# Patient Record
Sex: Male | Born: 1970 | Marital: Married | Smoking: Never smoker
Health system: Southern US, Community
[De-identification: ages and names within clinical notes are randomized; demographics above are authoritative.]

## PROBLEM LIST (undated history)

## (undated) DIAGNOSIS — E785 Hyperlipidemia, unspecified: Secondary | ICD-10-CM

## (undated) DIAGNOSIS — F909 Attention-deficit hyperactivity disorder, unspecified type: Secondary | ICD-10-CM

## (undated) HISTORY — DX: Hyperlipidemia, unspecified: E78.5

## (undated) HISTORY — DX: Attention-deficit hyperactivity disorder, unspecified type: F90.9

---

## 2006-08-21 ENCOUNTER — Ambulatory Visit: Payer: Self-pay | Admitting: Internal Medicine

## 2006-08-21 LAB — CONVERTED CEMR LAB
ALT: 162 units/L — ABNORMAL HIGH (ref 0–40)
AST: 75 units/L — ABNORMAL HIGH (ref 0–37)
Albumin: 4.1 g/dL (ref 3.5–5.2)
Alkaline Phosphatase: 48 units/L (ref 39–117)
BUN: 11 mg/dL (ref 6–23)
Basophils Absolute: 0 10*3/uL (ref 0.0–0.1)
Basophils Relative: 0.7 % (ref 0.0–1.0)
CO2: 30 meq/L (ref 19–32)
Calcium: 9.3 mg/dL (ref 8.4–10.5)
Chloride: 103 meq/L (ref 96–112)
Chol/HDL Ratio, serum: 6.3
Cholesterol: 249 mg/dL (ref 0–200)
Creatinine, Ser: 1 mg/dL (ref 0.4–1.5)
Eosinophil percent: 3 % (ref 0.0–5.0)
GFR calc non Af Amer: 90 mL/min
Glomerular Filtration Rate, Af Am: 109 mL/min/{1.73_m2}
Glucose, Bld: 102 mg/dL — ABNORMAL HIGH (ref 70–99)
HCT: 44.9 % (ref 39.0–52.0)
HDL: 39.8 mg/dL (ref >39.0)
Hemoglobin: 15.2 g/dL (ref 13.0–17.0)
LDL DIRECT: 198 mg/dL
Lymphocytes Relative: 30.8 % (ref 12.0–46.0)
MCHC: 33.9 g/dL (ref 30.0–36.0)
MCV: 98.7 fL (ref 78.0–100.0)
Monocytes Absolute: 0.5 10*3/uL (ref 0.2–0.7)
Monocytes Relative: 11.1 % — ABNORMAL HIGH (ref 3.0–11.0)
Neutro Abs: 2.3 10*3/uL (ref 1.4–7.7)
Neutrophils Relative %: 54.4 % (ref 43.0–77.0)
Platelets: 174 10*3/uL (ref 150–400)
Potassium: 4.3 meq/L (ref 3.5–5.1)
RBC: 4.55 M/uL (ref 4.22–5.81)
RDW: 11.5 % (ref 11.5–14.6)
Sodium: 139 meq/L (ref 135–145)
TSH: 2.91 microintl units/mL (ref 0.35–5.50)
Total Bilirubin: 1 mg/dL (ref 0.3–1.2)
Total Protein: 7.2 g/dL (ref 6.0–8.3)
Triglyceride fasting, serum: 54 mg/dL (ref 0–149)
VLDL: 11 mg/dL (ref 0–40)
WBC: 4.2 10*3/uL — ABNORMAL LOW (ref 4.5–10.5)

## 2006-08-28 ENCOUNTER — Ambulatory Visit: Payer: Self-pay | Admitting: Internal Medicine

## 2006-08-28 DIAGNOSIS — R740 Nonspecific elevation of levels of transaminase and lactic acid dehydrogenase [LDH]: Secondary | ICD-10-CM

## 2006-08-28 DIAGNOSIS — E785 Hyperlipidemia, unspecified: Secondary | ICD-10-CM

## 2006-10-28 ENCOUNTER — Ambulatory Visit: Payer: Self-pay | Admitting: Internal Medicine

## 2006-10-28 LAB — CONVERTED CEMR LAB
ALT: 106 units/L — ABNORMAL HIGH (ref 0–40)
AST: 53 units/L — ABNORMAL HIGH (ref 0–37)
Albumin: 4.2 g/dL (ref 3.5–5.2)
Alkaline Phosphatase: 52 units/L (ref 39–117)
Bilirubin, Direct: 0.2 mg/dL (ref 0.0–0.3)
Chol/HDL Ratio, serum: 5.4
Cholesterol: 240 mg/dL (ref 0–200)
HDL: 44.3 mg/dL (ref >39.0)
LDL DIRECT: 176.1 mg/dL
Total Bilirubin: 0.8 mg/dL (ref 0.3–1.2)
Total Protein: 7.1 g/dL (ref 6.0–8.3)
Triglyceride fasting, serum: 103 mg/dL (ref 0–149)
VLDL: 21 mg/dL (ref 0–40)

## 2006-11-07 ENCOUNTER — Ambulatory Visit: Payer: Self-pay | Admitting: Internal Medicine

## 2008-06-24 ENCOUNTER — Ambulatory Visit: Payer: Self-pay | Admitting: Internal Medicine

## 2008-06-28 LAB — CONVERTED CEMR LAB
Alkaline Phosphatase: 44 units/L (ref 39–117)
Bilirubin, Direct: 0.2 mg/dL (ref 0.0–0.3)
HDL: 43.1 mg/dL (ref >39.0)
Total Bilirubin: 1.3 mg/dL — ABNORMAL HIGH (ref 0.3–1.2)
Total CHOL/HDL Ratio: 5.9
VLDL: 13 mg/dL (ref 0–40)

## 2008-07-01 LAB — CONVERTED CEMR LAB: Hep B Core Total Ab: NEGATIVE

## 2008-07-08 ENCOUNTER — Encounter: Admission: RE | Admit: 2008-07-08 | Discharge: 2008-07-08 | Payer: Self-pay | Admitting: Internal Medicine

## 2009-01-18 ENCOUNTER — Ambulatory Visit: Payer: Self-pay | Admitting: Internal Medicine

## 2009-01-18 LAB — CONVERTED CEMR LAB
AST: 27 units/L (ref 0–37)
BUN: 13 mg/dL (ref 6–23)
Basophils Absolute: 0 10*3/uL (ref 0.0–0.1)
Bilirubin, Direct: 0.1 mg/dL (ref 0.0–0.3)
Calcium: 9.3 mg/dL (ref 8.4–10.5)
Cholesterol: 176 mg/dL (ref 0–200)
Creatinine, Ser: 0.9 mg/dL (ref 0.4–1.5)
GFR calc non Af Amer: 99.69 mL/min (ref >60)
Glucose, Bld: 100 mg/dL — ABNORMAL HIGH (ref 70–99)
HCT: 43.4 % (ref 39.0–52.0)
HDL: 29.8 mg/dL — ABNORMAL LOW (ref >39.00)
LDL Cholesterol: 131 mg/dL — ABNORMAL HIGH (ref 0–99)
Leukocytes, UA: NEGATIVE
Lymphs Abs: 1.7 10*3/uL (ref 0.7–4.0)
Monocytes Absolute: 0.5 10*3/uL (ref 0.1–1.0)
Monocytes Relative: 9.4 % (ref 3.0–12.0)
Neutrophils Relative %: 51.8 % (ref 43.0–77.0)
Nitrite: NEGATIVE
Platelets: 149 10*3/uL — ABNORMAL LOW (ref 150.0–400.0)
Potassium: 4.2 meq/L (ref 3.5–5.1)
RDW: 11.1 % — ABNORMAL LOW (ref 11.5–14.6)
Specific Gravity, Urine: 1.02 (ref 1.000–1.030)
TSH: 1.7 microintl units/mL (ref 0.35–5.50)
Total Bilirubin: 1 mg/dL (ref 0.3–1.2)
Total Protein, Urine: NEGATIVE mg/dL
Triglycerides: 75 mg/dL (ref 0.0–149.0)
VLDL: 15 mg/dL (ref 0.0–40.0)
WBC: 4.9 10*3/uL (ref 4.5–10.5)
pH: 5.5 (ref 5.0–8.0)

## 2009-06-14 ENCOUNTER — Ambulatory Visit: Payer: Self-pay | Admitting: Internal Medicine

## 2009-06-15 LAB — CONVERTED CEMR LAB
AST: 32 units/L (ref 0–37)
Alkaline Phosphatase: 45 units/L (ref 39–117)
Bilirubin, Direct: 0 mg/dL (ref 0.0–0.3)
Direct LDL: 169.8 mg/dL
HDL: 47.2 mg/dL (ref >39.00)
Total CHOL/HDL Ratio: 5
VLDL: 21.2 mg/dL (ref 0.0–40.0)

## 2012-04-06 ENCOUNTER — Telehealth: Payer: Self-pay | Admitting: Internal Medicine

## 2012-04-06 NOTE — Telephone Encounter (Addendum)
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 Pt last seen 2010

## 2012-04-07 MED ORDER — SCOPOLAMINE 1 MG/3DAYS TD PT72: 1.0000 | MEDICATED_PATCH | TRANSDERMAL | Status: AC

## 2012-04-07 NOTE — Telephone Encounter (Signed)
Ok per Dr Swords, rx sent in electronically 

## 2013-05-07 ENCOUNTER — Ambulatory Visit (INDEPENDENT_AMBULATORY_CARE_PROVIDER_SITE_OTHER): Payer: BC Managed Care – PPO | Admitting: Internal Medicine

## 2013-05-07 VITALS — BP 122/76 | HR 86 | Temp 98.0°F | Resp 16 | Ht 69.5 in | Wt 176.0 lb

## 2013-05-07 DIAGNOSIS — A493 Mycoplasma infection, unspecified site: Secondary | ICD-10-CM

## 2013-05-07 MED ORDER — AZITHROMYCIN 250 MG PO TABS: ORAL_TABLET | ORAL | Status: DC

## 2013-05-07 NOTE — Progress Notes (Signed)
  Subjective:    Patient ID: Tanner Bowen, male    DOB: 05/09/1971, 42 y.o.   MRN: [id number]  HPIc/o cough and ST for most of this week following lots of social activity last weekend No fever or chills nonprod cough No wheeze  Sl pnd Lots of business travel and use of voice next 2 weeks    Review of Systems     Objective:   Physical Exam BP 122/76  Pulse 86  Temp(Src) 98 F (36.7 C) (Oral)  Resp 16  Ht 5' 9.5" (1.765 m)  Wt 176 lb (79.833 kg)  BMI 25.63 kg/m2  SpO2 99% Perrla Conj clear Tms clear Nares boggy thr clear x sl erythema     Assessment & Plan:  Symptoms consistent with viral or mycoplasmal illness  Consideration for upcoming travel we'll treat with Zithromax, guaifenesin, and Sudafed as needed

## 2014-04-28 ENCOUNTER — Other Ambulatory Visit: Payer: Self-pay | Admitting: General Surgery

## 2015-08-31 ENCOUNTER — Other Ambulatory Visit: Payer: Self-pay | Admitting: Internal Medicine

## 2015-08-31 DIAGNOSIS — R740 Nonspecific elevation of levels of transaminase and lactic acid dehydrogenase [LDH]: Principal | ICD-10-CM

## 2015-08-31 DIAGNOSIS — R7401 Elevation of levels of liver transaminase levels: Secondary | ICD-10-CM

## 2015-09-06 ENCOUNTER — Other Ambulatory Visit: Payer: Self-pay

## 2016-09-17 ENCOUNTER — Other Ambulatory Visit: Payer: Self-pay | Admitting: Internal Medicine

## 2016-09-17 DIAGNOSIS — R7401 Elevation of levels of liver transaminase levels: Secondary | ICD-10-CM

## 2016-09-17 DIAGNOSIS — R740 Nonspecific elevation of levels of transaminase and lactic acid dehydrogenase [LDH]: Principal | ICD-10-CM

## 2016-09-18 ENCOUNTER — Ambulatory Visit
Admission: RE | Admit: 2016-09-18 | Discharge: 2016-09-18 | Disposition: A | Discharge status: Home / Self Care | Payer: BLUE CROSS/BLUE SHIELD | Source: Ambulatory Visit | Attending: Internal Medicine | Admitting: Internal Medicine

## 2016-09-18 DIAGNOSIS — R7401 Elevation of levels of liver transaminase levels: Secondary | ICD-10-CM

## 2016-09-18 DIAGNOSIS — R740 Nonspecific elevation of levels of transaminase and lactic acid dehydrogenase [LDH]: Principal | ICD-10-CM

## 2017-11-20 ENCOUNTER — Other Ambulatory Visit: Payer: Self-pay | Admitting: Family Medicine

## 2017-11-20 ENCOUNTER — Ambulatory Visit
Admission: RE | Admit: 2017-11-20 | Discharge: 2017-11-20 | Disposition: A | Discharge status: Home / Self Care | Payer: BLUE CROSS/BLUE SHIELD | Source: Ambulatory Visit | Attending: Family Medicine | Admitting: Family Medicine

## 2017-11-20 DIAGNOSIS — R059 Cough, unspecified: Secondary | ICD-10-CM

## 2017-11-20 DIAGNOSIS — R05 Cough: Secondary | ICD-10-CM

## 2019-11-26 ENCOUNTER — Ambulatory Visit: Payer: BC Managed Care – PPO | Attending: Internal Medicine

## 2019-11-26 DIAGNOSIS — Z20822 Contact with and (suspected) exposure to covid-19: Secondary | ICD-10-CM

## 2019-11-27 LAB — NOVEL CORONAVIRUS, NAA: SARS-CoV-2, NAA: NOT DETECTED

## 2020-02-18 ENCOUNTER — Other Ambulatory Visit: Payer: Self-pay | Admitting: Internal Medicine

## 2020-02-18 ENCOUNTER — Other Ambulatory Visit: Payer: Self-pay

## 2020-02-18 DIAGNOSIS — E785 Hyperlipidemia, unspecified: Secondary | ICD-10-CM

## 2020-03-07 ENCOUNTER — Encounter: Payer: Self-pay | Admitting: Neurology

## 2020-03-08 ENCOUNTER — Telehealth: Payer: Self-pay | Admitting: Neurology

## 2020-03-08 ENCOUNTER — Other Ambulatory Visit: Payer: Self-pay

## 2020-03-08 ENCOUNTER — Institutional Professional Consult (permissible substitution): Payer: BC Managed Care – PPO | Admitting: Neurology

## 2020-03-08 NOTE — Telephone Encounter (Signed)
Pt presented today for his 1pm apt at 1:10. Due to arriving > 5 min late we were unable to see the patient at the scheduled apt time of 1:00 pm. Dr Vickey Huger did have a opening at 3 pm this afternoon and front staff offered the patient to stay and be seen at the time but the patient could not wait that long. Pt will need to be rescheduled.

## 2020-03-10 ENCOUNTER — Ambulatory Visit
Admission: RE | Admit: 2020-03-10 | Discharge: 2020-03-10 | Disposition: A | Discharge status: Home / Self Care | Payer: No Typology Code available for payment source | Source: Ambulatory Visit | Attending: Internal Medicine | Admitting: Internal Medicine

## 2020-03-10 DIAGNOSIS — E785 Hyperlipidemia, unspecified: Secondary | ICD-10-CM

## 2021-03-06 ENCOUNTER — Other Ambulatory Visit: Payer: Self-pay | Admitting: Internal Medicine

## 2021-03-06 DIAGNOSIS — R7401 Elevation of levels of liver transaminase levels: Secondary | ICD-10-CM

## 2021-03-23 ENCOUNTER — Encounter: Payer: Self-pay | Admitting: Gastroenterology

## 2021-04-04 ENCOUNTER — Ambulatory Visit
Admission: RE | Admit: 2021-04-04 | Discharge: 2021-04-04 | Disposition: A | Discharge status: Home / Self Care | Payer: BC Managed Care – PPO | Source: Ambulatory Visit | Attending: Internal Medicine | Admitting: Internal Medicine

## 2021-04-04 DIAGNOSIS — R7401 Elevation of levels of liver transaminase levels: Secondary | ICD-10-CM

## 2021-04-10 IMAGING — CT CT CARDIAC CORONARY ARTERY CALCIUM SCORE
3 series · 12 of 20 positions shown, 14 images · non-contrast
Comparison: None.

CLINICAL DATA: High cholesterol

EXAM:
CT CARDIAC CORONARY ARTERY CALCIUM SCORE
TECHNIQUE: Non-contrast imaging through the heart was performed using
prospective ECG gating. Image post processing was performed on an
independent workstation, allowing for quantitative analysis of the
heart and coronary arteries. Note that this exam targets the heart
and the chest was not imaged in its entirety.

[Series 2: calcium scoring 2.00 qr36 bestdiast 70% hrt calciu · axial · 0.35mm/px · z∈[+1731,+1763]mm · 2 of 80 slices shown]
[im 16/80  vessel]
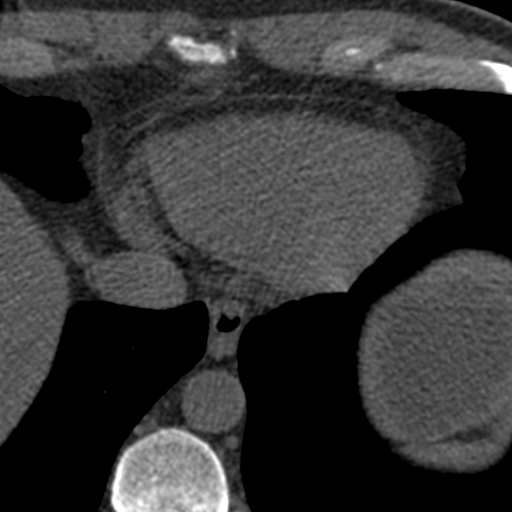
[im 32/80  vessel]
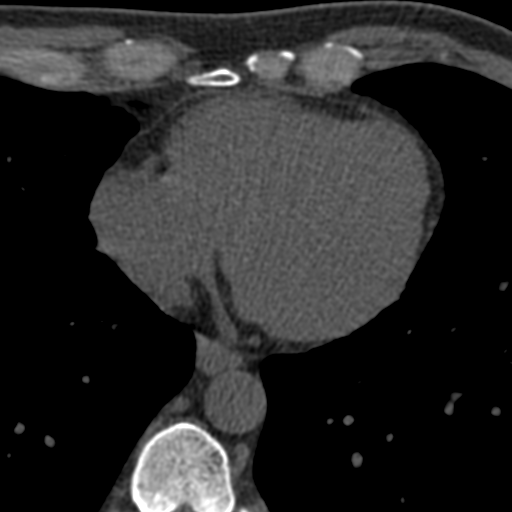

[Series 3: calcium scoring 2.00 br40 bestdiast 70% axial · axial · 0.54mm/px · z∈[+1727,+1831]mm · 5 of 80 slices shown, 7 images]
[im 14/80  vessel]
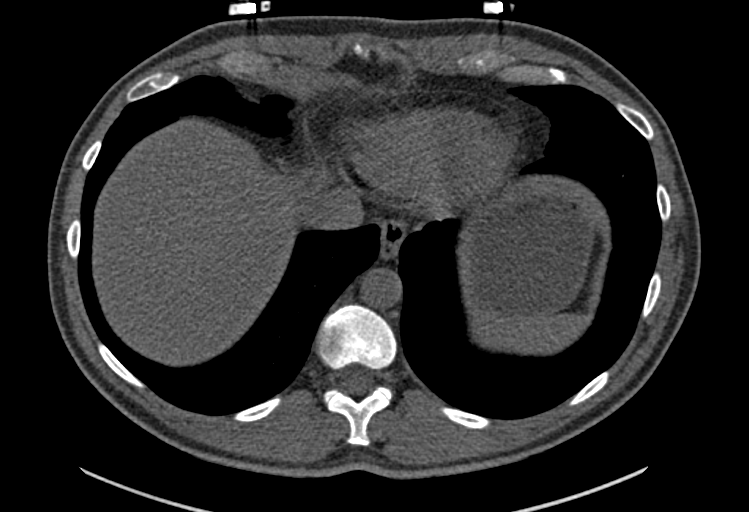
[im 14/80  lung]
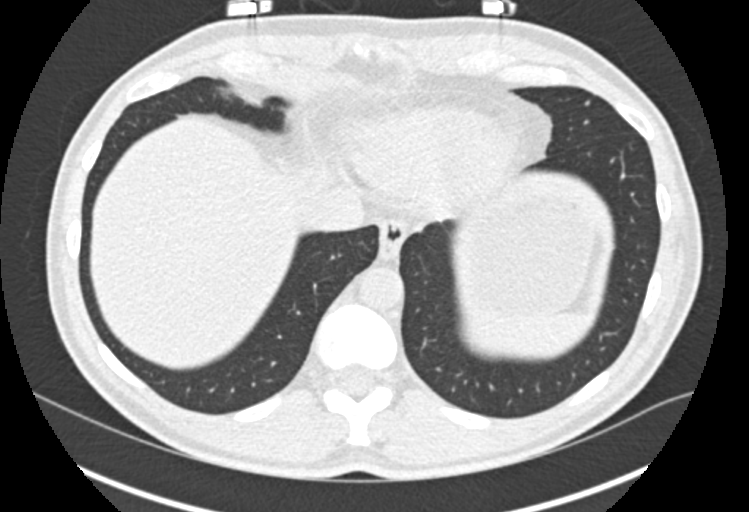
[im 27/80  vessel]
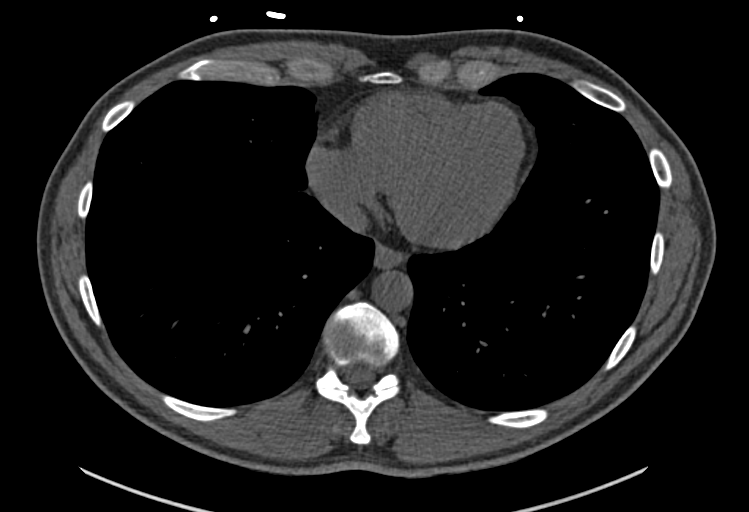
[im 40/80  vessel]
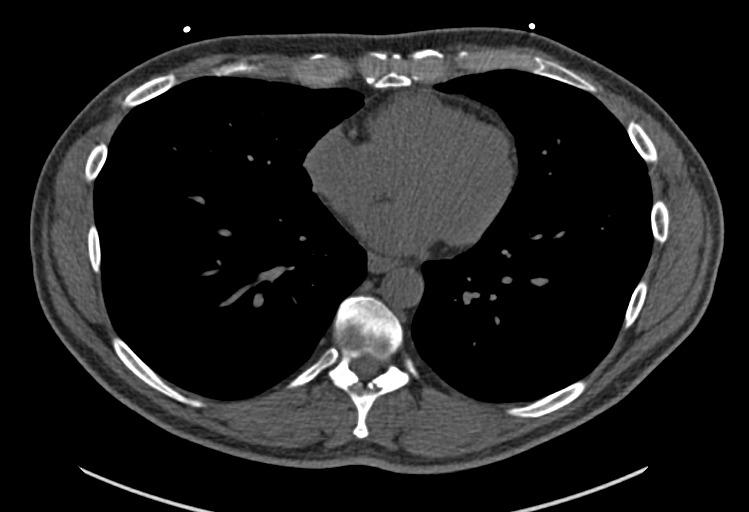
[im 53/80  vessel]
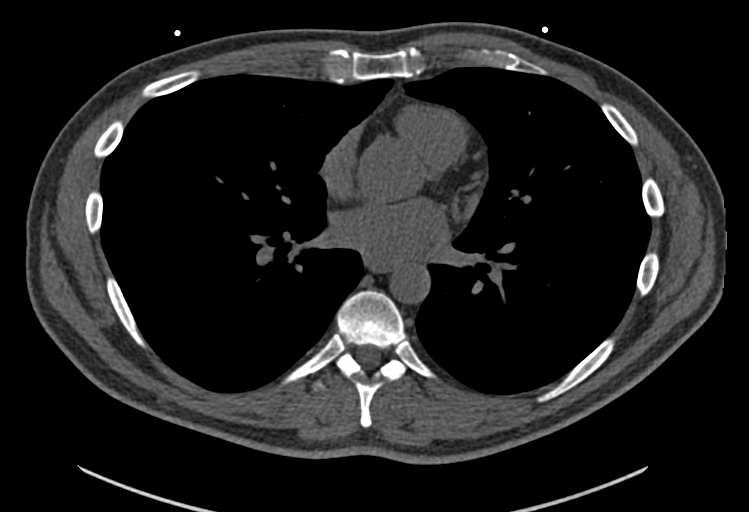
[im 66/80  vessel]
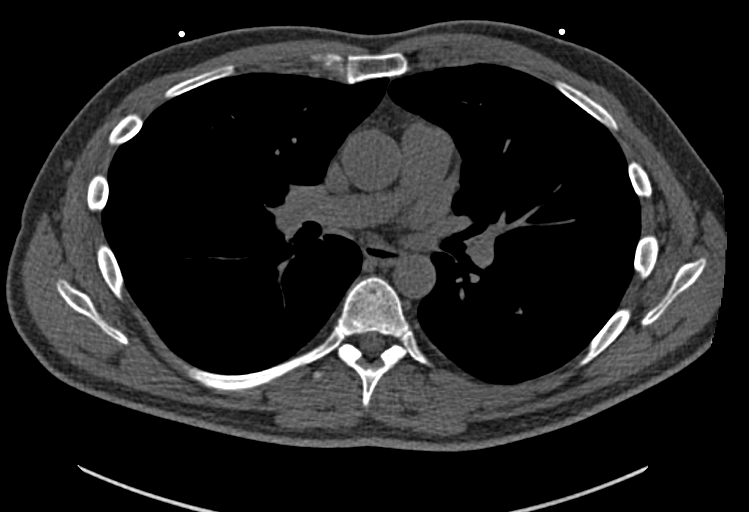
[im 66/80  lung]
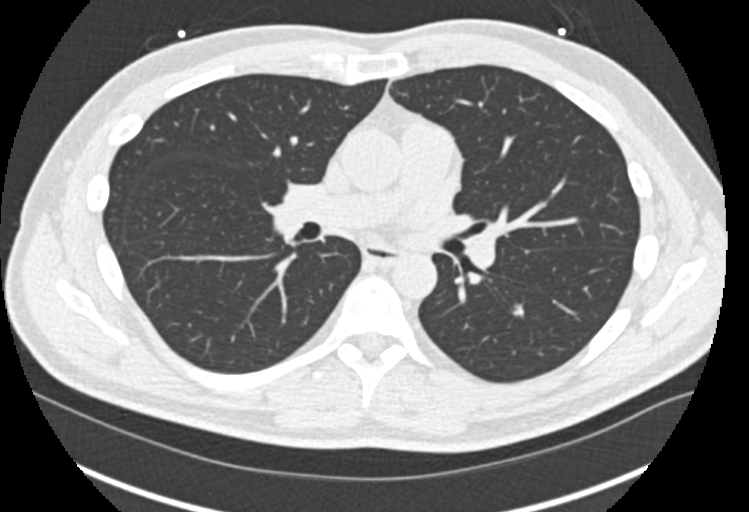

[Series 9: calcium scoring 2.00 br60 bestdiast 70% lungs · axial · 0.54mm/px · z∈[+1727,+1831]mm · 5 of 80 slices shown]
[im 14/80  vessel]
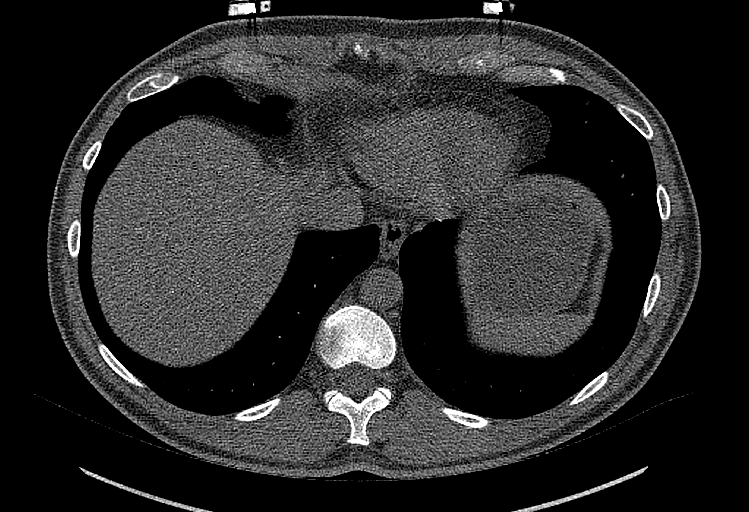
[im 27/80  vessel]
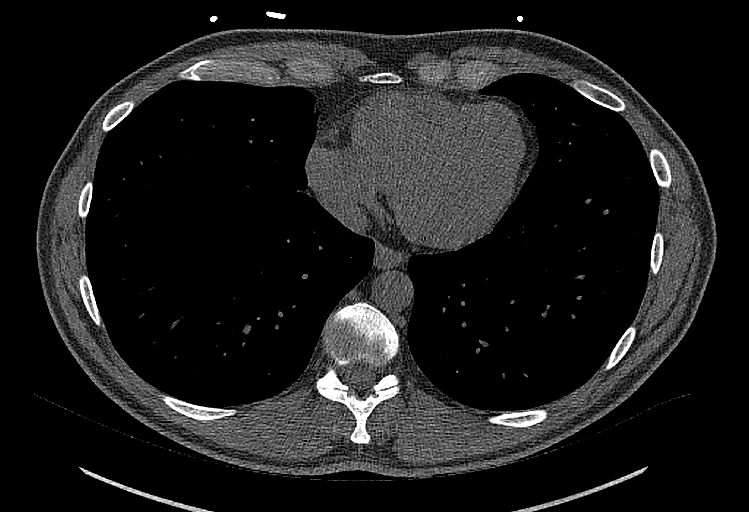
[im 40/80  vessel]
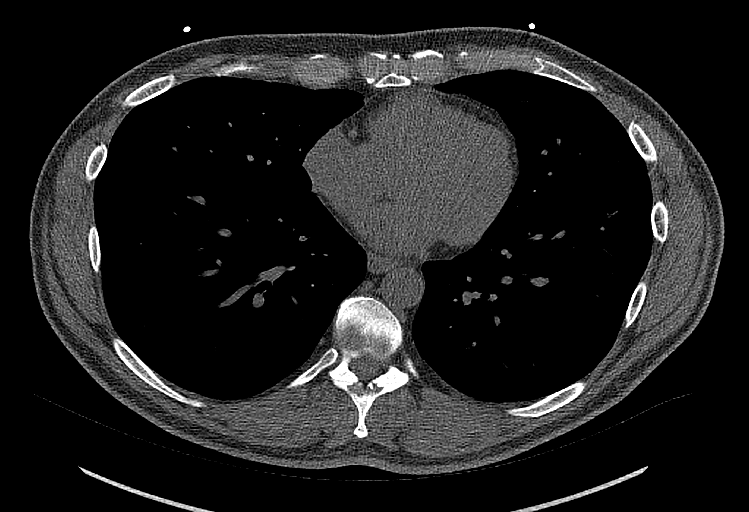
[im 53/80  vessel]
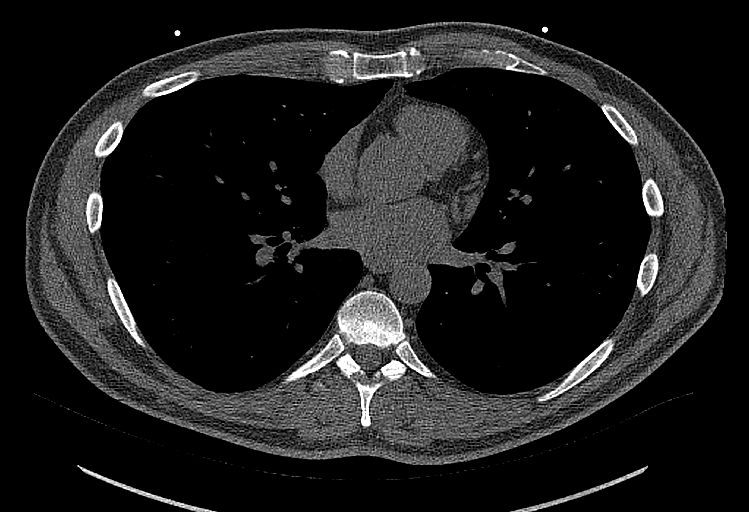
[im 66/80  vessel]
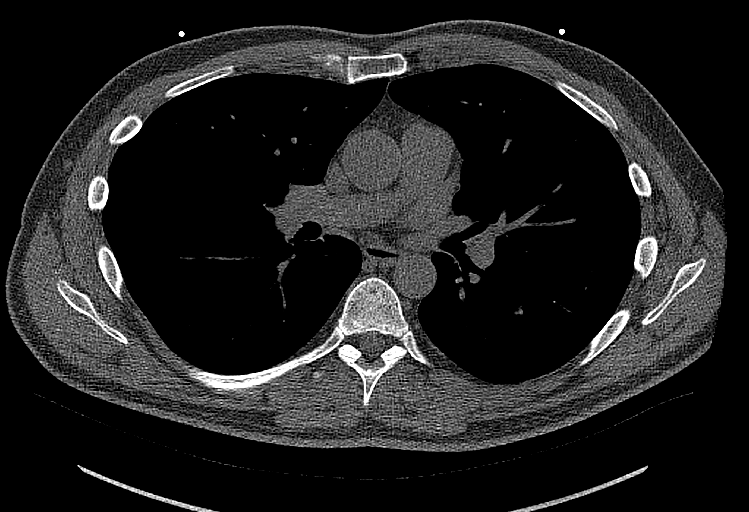

[12 of 20 positions shown; findings below may reference images not displayed]

FINDINGS: CORONARY CALCIUM SCORES:

Left Main: 0

LAD: 129

LCx:

RCA: 0

Total Agatston Score:

[HOSPITAL] percentile: 91

AORTA MEASUREMENTS:

Ascending Aorta: 31 mm

Descending Aorta: 24 mm

OTHER FINDINGS:

Heart is normal size. No adenopathy. Visualized lungs clear. No
effusions. Imaging into the upper abdomen shows no acute findings.
Chest wall soft tissues are unremarkable. No acute bony abnormality.
IMPRESSION: The observed calcium score of 134.4 is at the percentile 91 for
subjects of the same age, gender and race/ethnicity who are free of
clinical cardiovascular disease and treated diabetes.

No acute or significant extracardiac abnormality

## 2021-05-02 ENCOUNTER — Other Ambulatory Visit: Payer: Self-pay

## 2021-05-02 ENCOUNTER — Ambulatory Visit (AMBULATORY_SURGERY_CENTER): Payer: BC Managed Care – PPO | Admitting: *Deleted

## 2021-05-02 VITALS — Ht 71.0 in | Wt 172.0 lb

## 2021-05-02 DIAGNOSIS — Z1211 Encounter for screening for malignant neoplasm of colon: Secondary | ICD-10-CM

## 2021-05-02 NOTE — Progress Notes (Signed)

## 2021-05-16 ENCOUNTER — Other Ambulatory Visit: Payer: Self-pay

## 2021-05-16 ENCOUNTER — Ambulatory Visit (AMBULATORY_SURGERY_CENTER): Payer: BC Managed Care – PPO | Admitting: Gastroenterology

## 2021-05-16 ENCOUNTER — Encounter: Payer: Self-pay | Admitting: Gastroenterology

## 2021-05-16 VITALS — BP 102/63 | HR 62 | Temp 98.2°F | Resp 11 | Ht 69.5 in | Wt 172.0 lb

## 2021-05-16 DIAGNOSIS — Z1211 Encounter for screening for malignant neoplasm of colon: Secondary | ICD-10-CM | POA: Diagnosis not present

## 2021-05-16 MED ORDER — SODIUM CHLORIDE 0.9 % IV SOLN
500.0000 mL | Freq: Once | INTRAVENOUS | Status: DC
Start: 2021-05-16 — End: 2021-05-16

## 2021-05-16 NOTE — Progress Notes (Signed)
VS by [place]  Pt's states no medical or surgical changes since previsit or office visit.  

## 2021-05-16 NOTE — Op Note (Signed)
[facility] Patient Name: Tanner Bowen Procedure Date: 05/16/2021 9:11 AM MRN: [id number] Endoscopist: Tressia Danas MD, MD Age: 50 Referring MD:  Date of Birth: 01-Mar-1971 Gender: Male Account #: [id number] Procedure:                Colonoscopy Indications:              Screening for colorectal malignant neoplasm, This                            is the patient's first colonoscopy                           Distant cousin with colon cancer                           No first degree relatives with colon cancer or                            polyps Medicines:                Monitored Anesthesia Care Procedure:                Pre-Anesthesia Assessment:                           - Prior to the procedure, a History and Physical                            was performed, and patient medications and                            allergies were reviewed. The patient's tolerance of                            previous anesthesia was also reviewed. The risks                            and benefits of the procedure and the sedation                            options and risks were discussed with the patient.                            All questions were answered, and informed consent                            was obtained. Prior Anticoagulants: The patient has                            taken no previous anticoagulant or antiplatelet                            agents. ASA Grade Assessment: II - A patient with  mild systemic disease. After reviewing the risks                            and benefits, the patient was deemed in                            satisfactory condition to undergo the procedure.                           After obtaining informed consent, the colonoscope                            was passed under direct vision. Throughout the                            procedure, the patient's blood pressure, pulse, and                            oxygen  saturations were monitored continuously. The                            Colonoscope was introduced through the anus and                            advanced to the 3 cm into the ileum. A second                            forward view of the right colon was performed. The                            colonoscopy was performed without difficulty. The                            patient tolerated the procedure well. The quality                            of the bowel preparation was good. The terminal                            ileum, ileocecal valve, appendiceal orifice, and                            rectum were photographed. Scope In: 9:31:52 AM Scope Out: 9:47:00 AM Scope Withdrawal Time: 0 hours 12 minutes 39 seconds  Total Procedure Duration: 0 hours 15 minutes 8 seconds  Findings:                 The perianal and digital rectal examinations were                            normal.                           Multiple small and large-mouthed diverticula were  found in the sigmoid colon and descending colon.                           The exam was otherwise without abnormality on                            direct and retroflexion views. Complications:            No immediate complications. Estimated Blood Loss:     Estimated blood loss: none. Impression:               - Diverticulosis in the sigmoid colon and in the                            descending colon.                           - The examination was otherwise normal on direct                            and retroflexion views.                           - No specimens collected. Recommendation:           - Patient has a contact number available for                            emergencies. The signs and symptoms of potential                            delayed complications were discussed with the                            patient. Return to normal activities tomorrow.                            Written discharge  instructions were provided to the                            patient.                           - Follow a high fiber diet. Drink at least 64                            ounces of water daily. Add a daily stool bulking                            agent such as psyllium (an exampled would be                            Metamucil).                           - Continue present medications.                           -  Repeat colonoscopy in 10 years for surveillance,                            earlier with new symptoms.                           - Emerging evidence supports eating a diet of                            fruits, vegetables, grains, calcium, and yogurt                            while reducing red meat and alcohol may reduce the                            risk of colon cancer.                           - Thank you for allowing me to be involved in your                            colon cancer prevention. Tressia Danas MD, MD 05/16/2021 9:51:45 AM This report has been signed electronically.

## 2021-05-16 NOTE — Progress Notes (Signed)
To PACU, VSS. Report to [email or web address] 

## 2021-05-16 NOTE — Patient Instructions (Signed)
Handout given:  diverticulosis Resume previous diet Continue current medications Incorporate a high fiber diet Repeat colonoscopy in 10 years!!!!!  YOU HAD AN ENDOSCOPIC PROCEDURE TODAY AT THE [facility]:   Refer to the procedure report that was given to you for any specific questions about what was found during the examination.  If the procedure report does not answer your questions, please call your gastroenterologist to clarify.  If you requested that your care partner not be given the details of your procedure findings, then the procedure report has been included in a sealed envelope for you to review at your convenience later.  YOU SHOULD EXPECT: Some feelings of bloating in the abdomen. Passage of more gas than usual.  Walking can help get rid of the air that was put into your GI tract during the procedure and reduce the bloating. If you had a lower endoscopy (such as a colonoscopy or flexible sigmoidoscopy) you may notice spotting of blood in your stool or on the toilet paper. If you underwent a bowel prep for your procedure, you may not have a normal bowel movement for a few days.  Please Note:  You might notice some irritation and congestion in your nose or some drainage.  This is from the oxygen used during your procedure.  There is no need for concern and it should clear up in a day or so.  SYMPTOMS TO REPORT IMMEDIATELY:  Following lower endoscopy (colonoscopy or flexible sigmoidoscopy):  Excessive amounts of blood in the stool  Significant tenderness or worsening of abdominal pains  Swelling of the abdomen that is new, acute  Fever of 100F or higher  For urgent or emergent issues, a gastroenterologist can be reached at any hour by calling [phone]. Do not use MyChart messaging for urgent concerns.   DIET:  We do recommend a small meal at first, but then you may proceed to your regular diet.  Drink plenty of fluids but you should avoid alcoholic beverages for  24 hours.  ACTIVITY:  You should plan to take it easy for the rest of today and you should NOT DRIVE or use heavy machinery until tomorrow (because of the sedation medicines used during the test).    FOLLOW UP: Our staff will call the number listed on your records 48-72 hours following your procedure to check on you and address any questions or concerns that you may have regarding the information given to you following your procedure. If we do not reach you, we will leave a message.  We will attempt to reach you two times.  During this call, we will ask if you have developed any symptoms of COVID 19. If you develop any symptoms (ie: fever, flu-like symptoms, shortness of breath, cough etc.) before then, please call [phone].  If you test positive for Covid 19 in the 2 weeks post procedure, please call and report this information to [place].    If any biopsies were taken you will be contacted by phone or by letter within the next 1-3 weeks.  Please call us at [phone] if you have not heard about the biopsies in 3 weeks.   SIGNATURES/CONFIDENTIALITY: You and/or your care partner have signed paperwork which will be entered into your electronic medical record.  These signatures attest to the fact that that the information above on your After Visit Summary has been reviewed and is understood.  Full responsibility of the confidentiality of this discharge information lies with you and/or your care-partner.

## 2021-05-18 ENCOUNTER — Telehealth: Payer: Self-pay

## 2021-05-18 NOTE — Telephone Encounter (Signed)
  Follow up Call-  Call back number 05/16/2021  Post procedure Call Back phone  # [phone]  Permission to leave phone message Yes  Some recent data might be hidden     Patient questions:  Do you have a fever, pain , or abdominal swelling? No. Pain Score  0 *  Have you tolerated food without any problems? Yes.    Have you been able to return to your normal activities? Yes.    Do you have any questions about your discharge instructions: Diet   No. Medications  No. Follow up visit  No.  Do you have questions or concerns about your Care? No.  Actions: * If pain score is 4 or above: No action needed, pain <4.  Have you developed a fever since your procedure? no  2.   Have you had an respiratory symptoms (SOB or cough) since your procedure? no  3.   Have you tested positive for COVID 19 since your procedure no  4.   Have you had any family members/close contacts diagnosed with the COVID 19 since your procedure?  no   If yes to any of these questions please route to Laverna Peace, RN and Karlton Lemon, RN

## 2021-05-26 DIAGNOSIS — R931 Abnormal findings on diagnostic imaging of heart and coronary circulation: Secondary | ICD-10-CM | POA: Insufficient documentation

## 2021-05-26 NOTE — Progress Notes (Signed)
Cardiology Office Note   Date:  05/28/2021   ID:  Tanner Bowen, DOB [birth year], MRN [id number]  PCP:  Cleatis Polka., MD  Cardiologist:   Rollene Rotunda, MD Referring:  Cleatis Polka., MD   Chief Complaint  Patient presents with   Elevated coronary calcium       History of Present Illness: Tanner Bowen is a 50 y.o. male who presents for evaluation of an elevated coronary calcium score.  He is referred by Cleatis Polka., MD.   His score was 134 mostly in the LAD.  This was done last year.  He spent a year trying to watch his diet and his LDL which had been 130 is still about 130.  He exercises sporadically sometimes feeling risk exercise on a bike.  The patient denies any new symptoms such as chest discomfort, neck or arm discomfort. There has been no new shortness of breath, PND or orthopnea. There have been no reported palpitations, presyncope or syncope.   Of note he says he has had some elevated liver enzymes and a fatty liver.   Past Medical History:  Diagnosis Date   ADHD    Hyperlipidemia     No past surgical history on file.   Current Outpatient Medications  Medication Sig Dispense Refill   amphetamine-dextroamphetamine (ADDERALL) 10 MG tablet Take 10 mg by mouth 2 (two) times daily with a meal.     Multiple Vitamins-Minerals (CENTRUM SILVER 50+MEN PO) Take by mouth.     sildenafil (VIAGRA) 100 MG tablet Take one tablet 30 min  Before intercourse as needed.     pravastatin (PRAVACHOL) 40 MG tablet Take 1 tablet (40 mg total) by mouth every evening. (Patient not taking: Reported on 05/28/2021) 90 tablet 3   No current facility-administered medications for this visit.    Allergies:   Patient has no known allergies.    Social History:  The patient  reports that he has never smoked. He has never used smokeless tobacco. He reports current alcohol use. He reports that he does not use drugs.   Family History:  The patient's family history includes CAD  in his maternal grandfather and paternal grandfather; Colon cancer in his cousin; Colon polyps in his cousin; Diabetes Mellitus II in his maternal grandfather.    ROS:  Please see the history of present illness.   Otherwise, review of systems are positive for none.   All other systems are reviewed and negative.    PHYSICAL EXAM: VS:  BP 110/82 (BP Location: Left Arm, Patient Position: Sitting, Cuff Size: Normal)   Pulse 70   Ht 5\' 11"  (1.803 m)   Wt 169 lb 6.4 oz (76.8 kg)   SpO2 99%   BMI 23.63 kg/m  , BMI Body mass index is 23.63 kg/m. GENERAL:  Well appearing HEENT:  Pupils equal round and reactive, fundi not visualized, oral mucosa unremarkable NECK:  No jugular venous distention, waveform within normal limits, carotid upstroke brisk and symmetric, no bruits, no thyromegaly LYMPHATICS:  No cervical, inguinal adenopathy LUNGS:  Clear to auscultation bilaterally BACK:  No CVA tenderness CHEST:  Unremarkable HEART:  PMI not displaced or sustained,S1 and S2 within normal limits, no S3, no S4, no clicks, no rubs, no murmurs ABD:  Flat, positive bowel sounds normal in frequency in pitch, no bruits, no rebound, no guarding, no midline pulsatile mass, no hepatomegaly, no splenomegaly EXT:  2 plus pulses throughout, no edema, no cyanosis no clubbing SKIN:  No rashes no nodules NEURO:  Cranial nerves II through XII grossly intact, motor grossly intact throughout PSYCH:  Cognitively intact, oriented to person place and time    EKG:  EKG is ordered today. The ekg ordered today demonstrates sinus rhythm, rate 78, axis within normal limits, RSR prime V1 V2, possible incomplete right bundle branch block, no acute ST-T wave changes.   Recent Labs: No results found for requested labs within last 8760 hours.    Lipid Panel    Component Value Date/Time   CHOL 227 (H) 06/14/2009 0956   TRIG 106.0 06/14/2009 0956   TRIG 103 10/28/2006 0855   HDL 47.20 06/14/2009 0956   CHOLHDL 5  06/14/2009 0956   VLDL 21.2 06/14/2009 0956   LDLCALC 131 (H) 01/18/2009 0933   LDLDIRECT 169.8 06/14/2009 0956      Wt Readings from Last 3 Encounters:  05/28/21 169 lb 6.4 oz (76.8 kg)  05/16/21 172 lb (78 kg)  05/02/21 172 lb (78 kg)      Other studies Reviewed: Additional studies/ records that were reviewed today include: Labs. Review of the above records demonstrates:  Please see elsewhere in the note.     ASSESSMENT AND PLAN:  ELEVATED CORONARY CALCIUM:   I am going to bring him back for a POET (Plain Old Exercise Treadmill).  We had a long discussion about a plant-based Mediterranean diet.  We had a long discussion about an exercise regimen.  Further treatment of his lipids will be as below.  DYSLIPIDEMIA: He is MESA score was 6.1.  He would then not qualify for aspirin or statin but I did discuss with him whether he would be personal preference she is to take something to lower the cholesterol since he is really worked on with his diet.  He does not want to take Crestor but he would think about a statin that does not affect his liver.  I think lower risk would be pravastatin and so I will start 40 mg discontinuing the Crestor.  We will put him down for a lipid profile in 3 months.   Current medicines are reviewed at length with the patient today.  The patient does not have concerns regarding medicines.  The following changes have been made: As above  Labs/ tests ordered today include:   Orders Placed This Encounter  Procedures   Lipid panel   Exercise Tolerance Test      Disposition:   FU with me in one year.     Signed, Rollene Rotunda, MD  05/28/2021 9:42 AM    [facility]

## 2021-05-28 ENCOUNTER — Encounter: Payer: Self-pay | Admitting: Cardiology

## 2021-05-28 ENCOUNTER — Other Ambulatory Visit: Payer: Self-pay

## 2021-05-28 ENCOUNTER — Ambulatory Visit (INDEPENDENT_AMBULATORY_CARE_PROVIDER_SITE_OTHER): Payer: BC Managed Care – PPO | Admitting: Cardiology

## 2021-05-28 VITALS — BP 110/82 | HR 70 | Ht 71.0 in | Wt 169.4 lb

## 2021-05-28 DIAGNOSIS — R931 Abnormal findings on diagnostic imaging of heart and coronary circulation: Secondary | ICD-10-CM | POA: Diagnosis not present

## 2021-05-28 MED ORDER — PRAVASTATIN SODIUM 40 MG PO TABS: 40.0000 mg | ORAL_TABLET | Freq: Every evening | ORAL | 3 refills | Status: DC

## 2021-05-28 NOTE — Patient Instructions (Signed)
Medication Instructions:  STOP the Crestor  START the Pravastatin 40 mg once daily  *If you need a refill on your cardiac medications before your next appointment, please call your pharmacy*   Lab Work: Your provider would like for you to return in 3 months to have the following labs drawn: fasting lipid. You do not need an appointment for the lab. Once in our office lobby there is a podium where you can sign in and ring the doorbell to alert [place] that you are here. The lab is open from 8:00 am to 4:30 pm; closed for lunch from 12:45pm-1:45pm.  If you have labs (blood work) drawn today and your tests are completely normal, you will receive your results only by: MyChart Message (if you have MyChart) OR A paper copy in the mail If you have any lab test that is abnormal or we need to change your treatment, we will call you to review the results.   Testing/Procedures: Your physician has requested that you have an exercise tolerance test. For further information please visit [email or web address] Please also follow instruction sheet, as given. This will take place at [address], [address]. Do not drink or eat foods with caffeine for 24 hours before the test. (Chocolate, coffee, tea, or energy drinks) If you use an inhaler, bring it with you to the test. Do not smoke for 4 hours before the test. Wear comfortable shoes and clothing.  Follow-Up: At [facility], you and your health needs are our priority.  As part of our continuing mission to provide you with exceptional heart care, we have created designated Provider Care Teams.  These Care Teams include your primary Cardiologist (physician) and Advanced Practice Providers (APPs -  Physician Assistants and Nurse Practitioners) who all work together to provide you with the care you need, when you need it.  We recommend signing up for the patient portal called "MyChart".  Sign up information is provided on this After Visit Summary.  MyChart  is used to connect with patients for Virtual Visits (Telemedicine).  Patients are able to view lab/test results, encounter notes, upcoming appointments, etc.  Non-urgent messages can be sent to your provider as well.   To learn more about what you can do with MyChart, go to [email or web address].    Your next appointment:   12 month(s)  The format for your next appointment:   In Person  Provider:   You may see Dr. Antoine Poche or one of the following Advanced Practice Providers on your designated Care Team:   Theodore Demark, PA-C Juanda Crumble, PA-C Joni Reining, DNP, ANP   Other Instructions Please watch the Game [employer]anger documentary

## 2021-05-31 ENCOUNTER — Telehealth (HOSPITAL_COMMUNITY): Payer: Self-pay | Admitting: *Deleted

## 2021-05-31 ENCOUNTER — Ambulatory Visit: Payer: BC Managed Care – PPO | Admitting: Cardiology

## 2021-05-31 NOTE — Telephone Encounter (Signed)
Close encounter 

## 2021-06-05 ENCOUNTER — Ambulatory Visit (HOSPITAL_COMMUNITY)
Admission: RE | Admit: 2021-06-05 | Discharge: 2021-06-05 | Disposition: A | Discharge status: Home / Self Care | Payer: BC Managed Care – PPO | Source: Ambulatory Visit | Attending: Cardiology | Admitting: Cardiology

## 2021-06-05 ENCOUNTER — Other Ambulatory Visit: Payer: Self-pay

## 2021-06-05 DIAGNOSIS — R931 Abnormal findings on diagnostic imaging of heart and coronary circulation: Secondary | ICD-10-CM | POA: Insufficient documentation

## 2021-06-05 LAB — EXERCISE TOLERANCE TEST
Estimated workload: 17.2 METS
Exercise duration (min): 14 min
Exercise duration (sec): 0 s
MPHR: 169 {beats}/min
Peak HR: 169 {beats}/min
Percent HR: 100 %
Rest HR: 52 {beats}/min

## 2021-09-19 ENCOUNTER — Encounter: Payer: Self-pay | Admitting: *Deleted

## 2021-09-19 ENCOUNTER — Other Ambulatory Visit: Payer: Self-pay | Admitting: Cardiology

## 2021-09-19 MED ORDER — PRAVASTATIN SODIUM 40 MG PO TABS: 40.0000 mg | ORAL_TABLET | Freq: Every evening | ORAL | 2 refills | Status: AC

## 2022-05-05 IMAGING — US US ABDOMEN LIMITED
1 series · 14 of 25 positions shown · non-contrast
Comparison: Abdominal ultrasound 07/08/2008

CLINICAL DATA: Elevated transaminase.

EXAM:
ULTRASOUND ABDOMEN LIMITED RIGHT UPPER QUADRANT

[Series 1: us abdomen limited · 0.25mm/px · 14 of 39 slices shown]
[im 1/39]
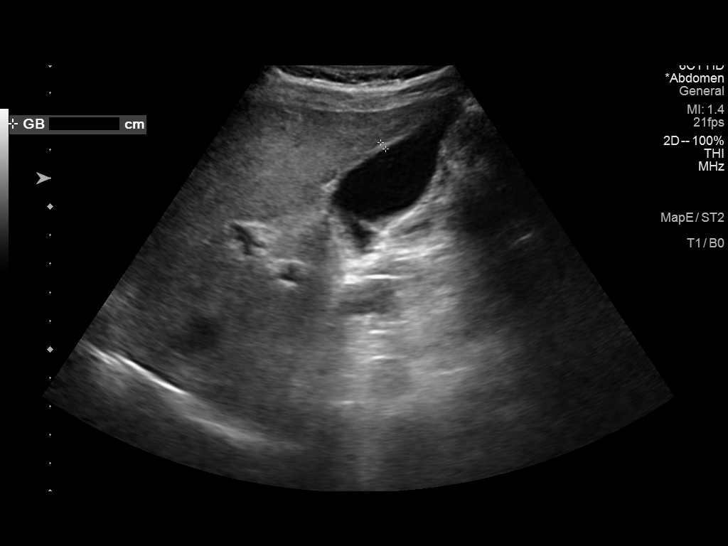
[im 4/39]
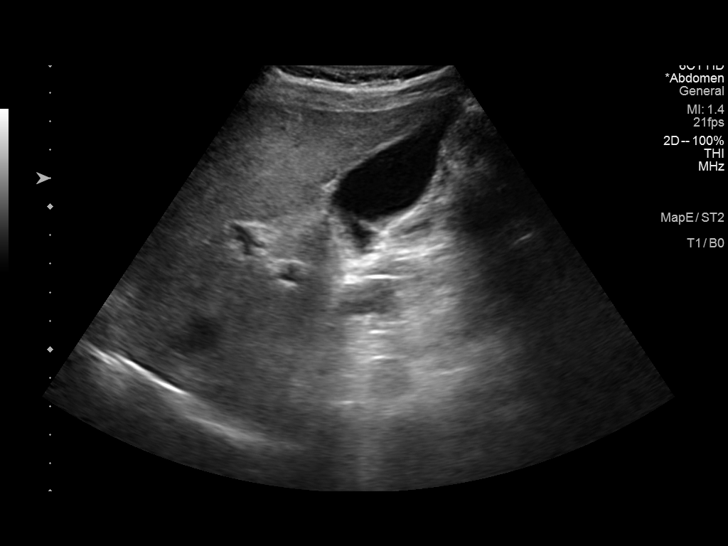
[im 7/39]
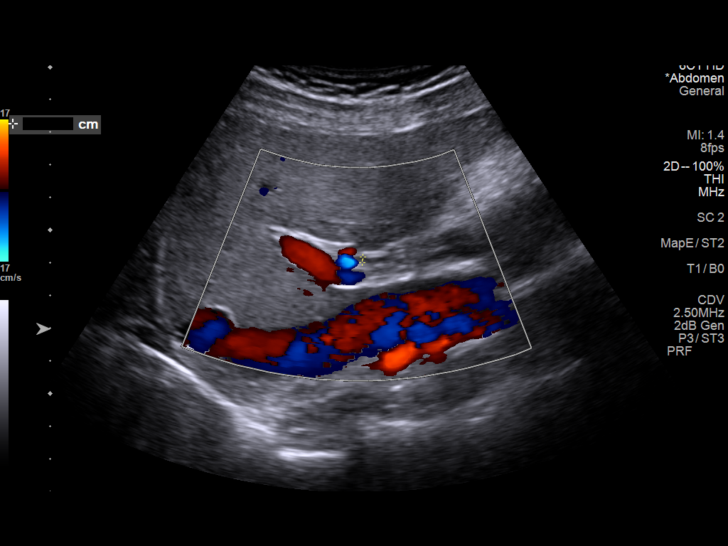
[im 10/39]
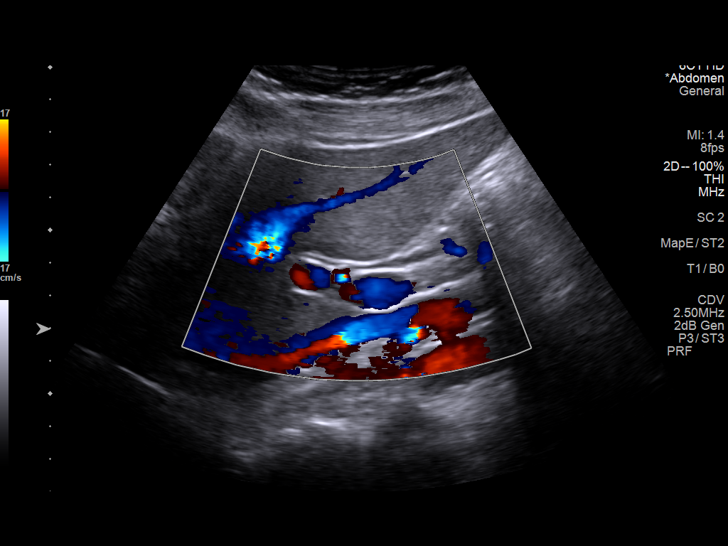
[im 13/39]
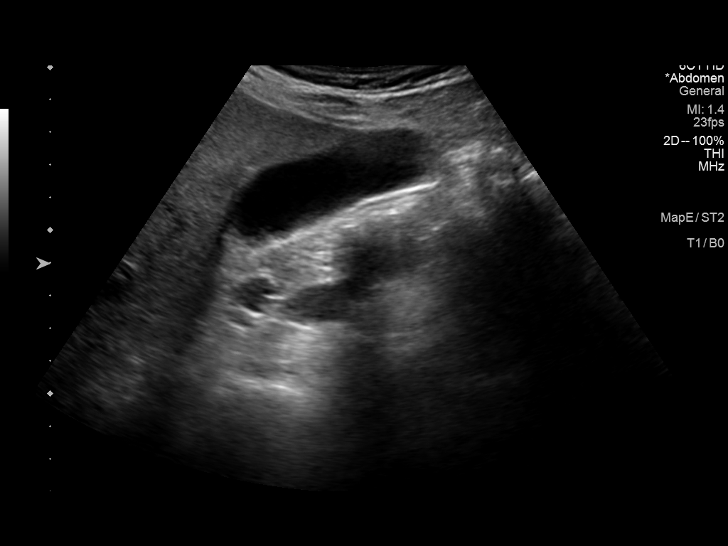
[im 15/39]
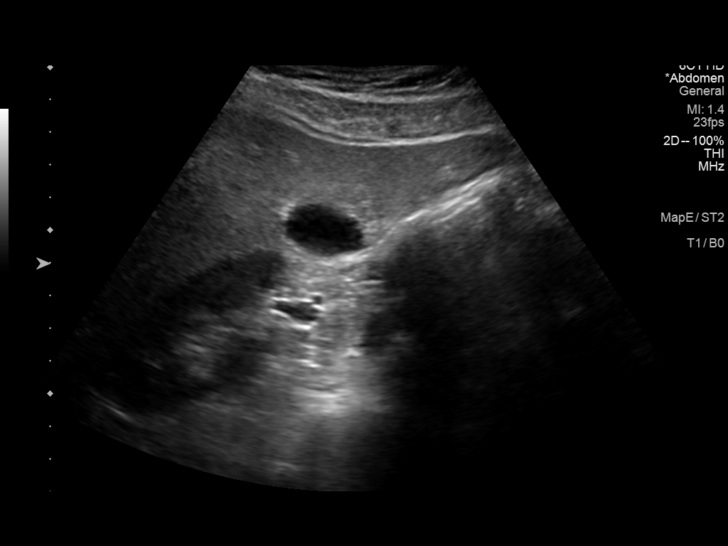
[im 18/39]
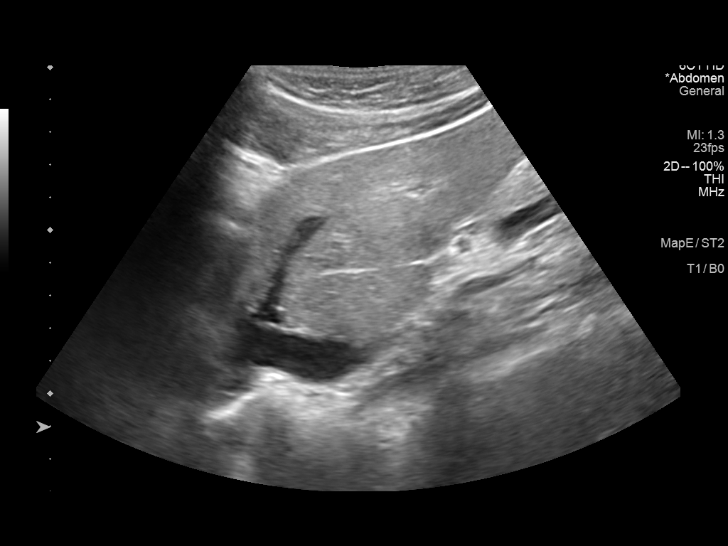
[im 21/39]
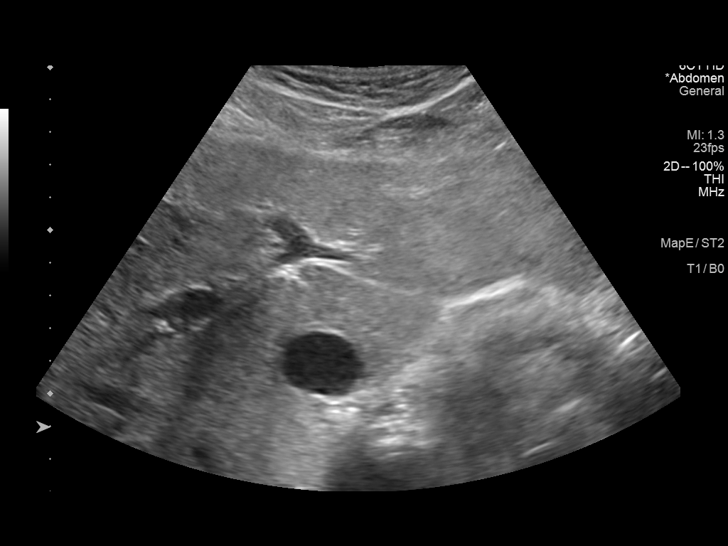
[im 24/39]
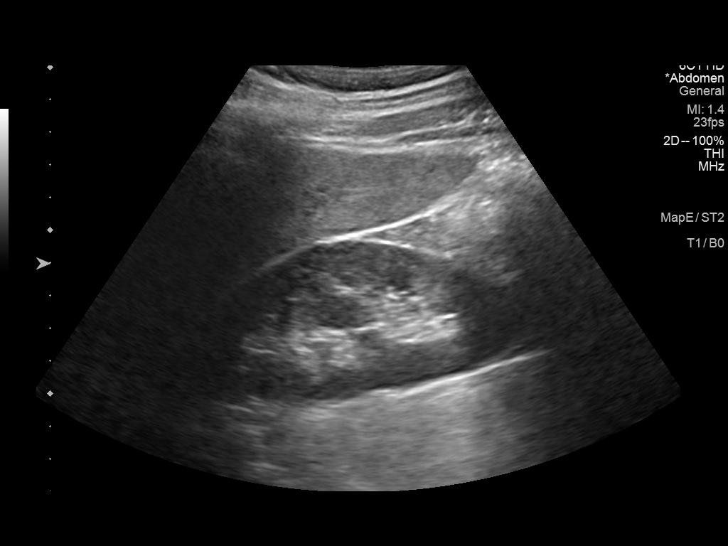
[im 26/39]
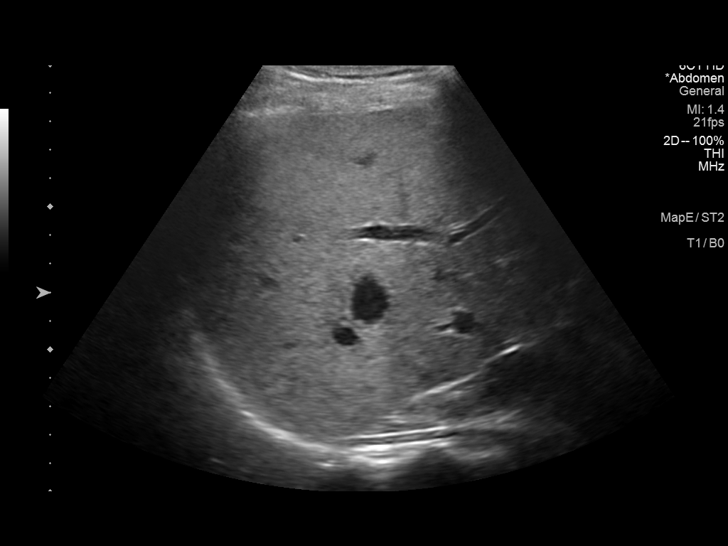
[im 29/39]
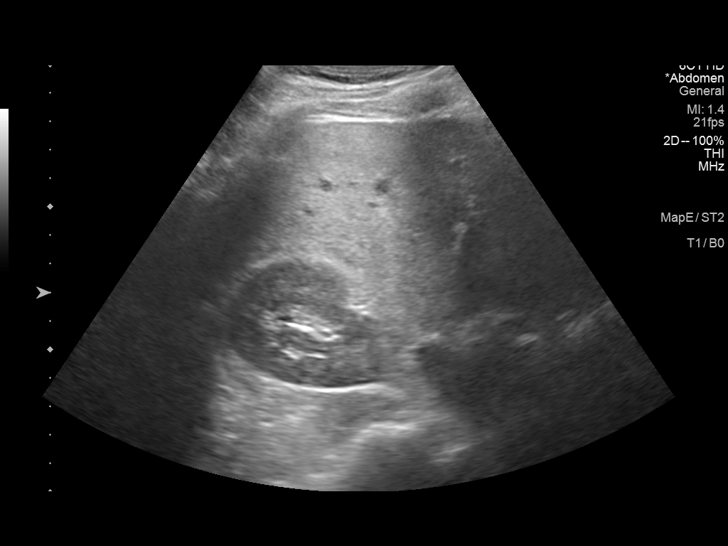
[im 32/39]
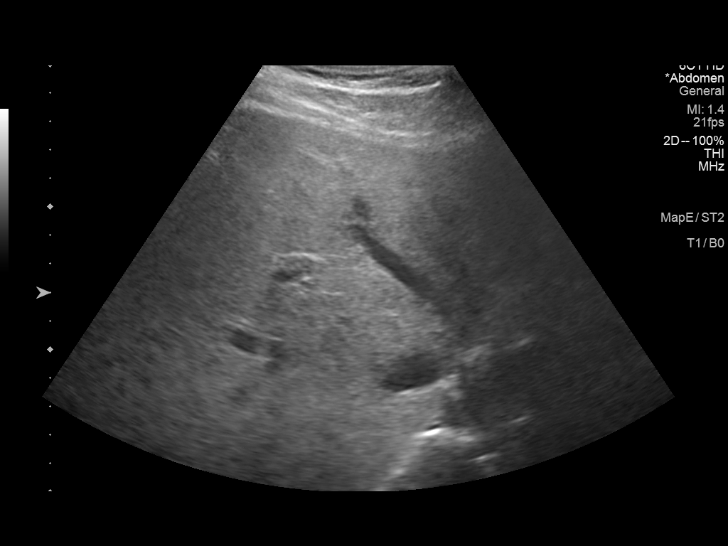
[im 35/39]
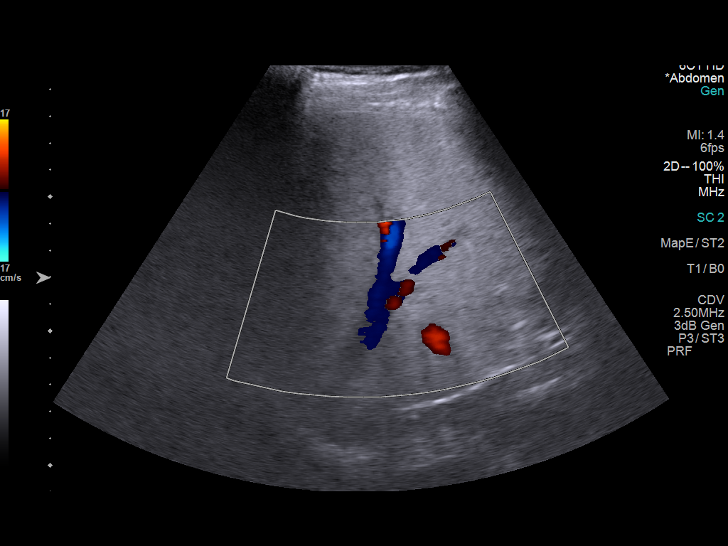
[im 39/39]
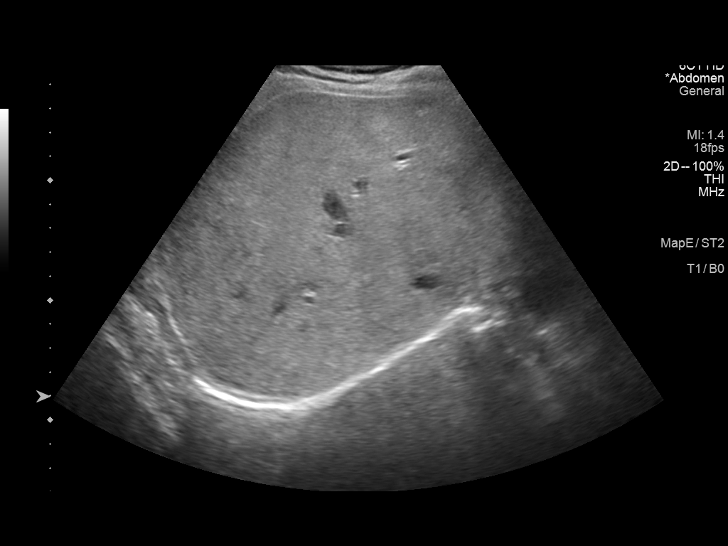

[14 of 25 positions shown; findings below may reference images not displayed]

FINDINGS: Gallbladder:

No gallstones or wall thickening visualized. No sonographic Murphy
sign noted by sonographer.

Common bile duct:

Diameter: 2 mm

Liver:

Persistent diffusely increased parenchymal echogenicity without a
focal lesion identified. Portal vein is patent on color Doppler
imaging with normal direction of blood flow towards the liver.

Other: None.
IMPRESSION: 1. Echogenic liver compatible with steatosis.
2. No gallstones or biliary dilatation.

## 2022-06-11 NOTE — Progress Notes (Unsigned)
Cardiology Office Note   Date:  06/12/2022   ID:  Tanner Bowen, DOB 11/25/1970, MRN [id number]  PCP:  Cleatis Polka., MD  Cardiologist:   Rollene Rotunda, MD Referring:  Cleatis Polka., MD   Chief Complaint  Patient presents with   Elevated coronary calcium       History of Present Illness: Tanner Bowen is a 51 y.o. male who presents for evaluation of an elevated coronary calcium score.  He is referred by Cleatis Polka., MD.   His score was 134 mostly in the LAD.  This was done in 2021.  He is done well since I saw him.  Has not been exercising because he is been traveling.  He did change his diet.  He actually ended up not taking the pravastatin which we talked about quite a bit.  His LDL previously had been 130 with an HDL of 48.  He has not had this repeated since last year. The patient denies any new symptoms such as chest discomfort, neck or arm discomfort. There has been no new shortness of breath, PND or orthopnea. There have been no reported palpitations, presyncope or syncope.    Past Medical History:  Diagnosis Date   ADHD    Hyperlipidemia     History reviewed. No pertinent surgical history.   Current Outpatient Medications  Medication Sig Dispense Refill   amphetamine-dextroamphetamine (ADDERALL) 10 MG tablet Take 10 mg by mouth 2 (two) times daily with a meal.     Multiple Vitamins-Minerals (CENTRUM SILVER 50+MEN PO) Take by mouth.     sildenafil (VIAGRA) 100 MG tablet Take one tablet 30 min  Before intercourse as needed.     pravastatin (PRAVACHOL) 40 MG tablet Take 1 tablet (40 mg total) by mouth every evening. (Patient not taking: Reported on 06/12/2022) 90 tablet 2   No current facility-administered medications for this visit.    Allergies:   Patient has no known allergies.    ROS:  Please see the history of present illness.   Otherwise, review of systems are positive for none.   All other systems are reviewed and negative.    PHYSICAL  EXAM: VS:  BP 108/78   Pulse (!) 55   Ht 5\' 11"  (1.803 m)   Wt 170 lb 12.8 oz (77.5 kg)   SpO2 99%   BMI 23.82 kg/m  , BMI Body mass index is 23.82 kg/m. GENERAL:  Well appearing NECK:  No jugular venous distention, waveform within normal limits, carotid upstroke brisk and symmetric, no bruits, no thyromegaly LUNGS:  Clear to auscultation bilaterally CHEST:  Unremarkable HEART:  PMI not displaced or sustained,S1 and S2 within normal limits, no S3, no S4, no clicks, no rubs, no murmurs ABD:  Flat, positive bowel sounds normal in frequency in pitch, no bruits, no rebound, no guarding, no midline pulsatile mass, no hepatomegaly, no splenomegaly EXT:  2 plus pulses throughout, no edema, no cyanosis no clubbing   EKG:  EKG is  ordered today. The ekg ordered today demonstrates sinus rhythm, rate 55, axis within normal limits, RSR prime V1 V2, possible incomplete right bundle branch block, no acute ST-T wave changes.   Recent Labs: No results found for requested labs within last 365 days.    Lipid Panel    Component Value Date/Time   CHOL 227 (H) 06/14/2009 0956   TRIG 106.0 06/14/2009 0956   TRIG 103 10/28/2006 0855   HDL 47.20 06/14/2009 0956  CHOLHDL 5 06/14/2009 0956   VLDL 21.2 06/14/2009 0956   LDLCALC 131 (H) 01/18/2009 0933   LDLDIRECT 169.8 06/14/2009 0956      Wt Readings from Last 3 Encounters:  06/12/22 170 lb 12.8 oz (77.5 kg)  05/28/21 169 lb 6.4 oz (76.8 kg)  05/16/21 172 lb (78 kg)      Other studies Reviewed: Additional studies/ records that were reviewed today include: None. Review of the above records demonstrates:  NA  ASSESSMENT AND PLAN:  ELEVATED CORONARY CALCIUM:   We again had a long discussion about dyslipidemia.  He had no new symptoms so no further testing is indicated.  We talked about adding exercise back to his regimen and again dissipating in diet.   DYSLIPIDEMIA: He is MESA score was only 6.1 previously.  We then again a long  discussion and I Ernie Hew check a fasting lipid today since he is been dieting.  Ultimately he thinks he might restart pravastatin which she stopped and I will anticipate he is going to do this and also put a 46-month lipid profile in checking his response to that medication.    Current medicines are reviewed at length with the patient today.  The patient does not have concerns regarding medicines.  The following changes have been made:  None  Labs/ tests ordered today include:   Orders Placed This Encounter  Procedures   Lipid panel   Lipid panel   EKG 12-Lead      Disposition:   FU with 12 months   Signed, Rollene Rotunda, MD  06/12/2022 11:19 AM    [facility]

## 2022-06-12 ENCOUNTER — Ambulatory Visit (INDEPENDENT_AMBULATORY_CARE_PROVIDER_SITE_OTHER): Payer: BC Managed Care – PPO | Admitting: Cardiology

## 2022-06-12 ENCOUNTER — Encounter: Payer: Self-pay | Admitting: Cardiology

## 2022-06-12 VITALS — BP 108/78 | HR 55 | Ht 71.0 in | Wt 170.8 lb

## 2022-06-12 DIAGNOSIS — R931 Abnormal findings on diagnostic imaging of heart and coronary circulation: Secondary | ICD-10-CM

## 2022-06-12 DIAGNOSIS — E785 Hyperlipidemia, unspecified: Secondary | ICD-10-CM

## 2022-06-12 LAB — HEPATIC FUNCTION PANEL
ALT: 51 IU/L — ABNORMAL HIGH (ref 0–44)
AST: 43 IU/L — ABNORMAL HIGH (ref 0–40)
Albumin: 4.9 g/dL (ref 3.8–4.9)
Alkaline Phosphatase: 47 IU/L (ref 44–121)
Bilirubin Total: 0.9 mg/dL (ref 0.0–1.2)
Bilirubin, Direct: 0.2 mg/dL (ref 0.00–0.40)
Total Protein: 7 g/dL (ref 6.0–8.5)

## 2022-06-12 LAB — LIPID PANEL
Chol/HDL Ratio: 3.7 ratio (ref 0.0–5.0)
Cholesterol, Total: 219 mg/dL — ABNORMAL HIGH (ref 100–199)
HDL: 59 mg/dL (ref >39)
LDL Chol Calc (NIH): 151 mg/dL — ABNORMAL HIGH (ref 0–99)
Triglycerides: 51 mg/dL (ref 0–149)
VLDL Cholesterol Cal: 9 mg/dL (ref 5–40)

## 2022-06-12 NOTE — Addendum Note (Signed)
Addended by: Tobin Chad on: 06/12/2022 11:22 AM   Modules accepted: Orders

## 2022-06-12 NOTE — Patient Instructions (Signed)
Medication Instructions:   No changes   *If you need a refill on your cardiac medications before your next appointment, please call your pharmacy*   Lab Work: Lipid- now Fasting Lipids - Nov 2023   If you have labs (blood work) drawn today and your tests are completely normal, you will receive your results only by: MyChart Message (if you have MyChart) OR A paper copy in the mail If you have any lab test that is abnormal or we need to change your treatment, we will call you to review the results.   Testing/Procedures: Not needed   Follow-Up: At [facility], you and your health needs are our priority.  As part of our continuing mission to provide you with exceptional heart care, we have created designated Provider Care Teams.  These Care Teams include your primary Cardiologist (physician) and Advanced Practice Providers (APPs -  Physician Assistants and Nurse Practitioners) who all work together to provide you with the care you need, when you need it.     Your next appointment:   1 year(s)  The format for your next appointment:   In Person  Provider:   Rollene Rotunda, MD

## 2022-06-18 ENCOUNTER — Encounter: Payer: Self-pay | Admitting: *Deleted

## 2022-09-26 ENCOUNTER — Encounter: Payer: Self-pay | Admitting: *Deleted

## 2023-12-31 ENCOUNTER — Other Ambulatory Visit: Payer: Self-pay | Admitting: Nurse Practitioner

## 2023-12-31 DIAGNOSIS — K760 Fatty (change of) liver, not elsewhere classified: Secondary | ICD-10-CM

## 2024-01-17 ENCOUNTER — Encounter: Payer: Self-pay | Admitting: Cardiology

## 2024-01-22 ENCOUNTER — Ambulatory Visit: Attending: Nurse Practitioner | Admitting: Nurse Practitioner

## 2024-01-22 NOTE — Progress Notes (Deleted)
 Office Visit    Patient Name: Tanner Bowen Date of Encounter: 01/22/2024  Primary Care Provider:  Cleatis Polka., MD Primary Cardiologist:  Rollene Rotunda, MD  Chief Complaint    53 year old male with a history of elevated coronary artery calcium score, hyperlipidemia, and ADHD who presents for follow-up related to elevated coronary artery calcium score.  Past Medical History    Past Medical History:  Diagnosis Date   ADHD    Hyperlipidemia    No past surgical history on file.  Allergies  No Known Allergies   Labs/Other Studies Reviewed    The following studies were reviewed today:  Cardiac Studies & Procedures   ______________________________________________________________________________________________   STRESS TESTS  EXERCISE TOLERANCE TEST (ETT) 06/05/2021  Narrative  The patient walked on a standard Bruce protocol treadmill test for a total of 14 minutes.  He achieved a peak heart rate of 169 which is 100% predicted maximal heart rate.  At peak exercise he had no ST or T wave changes. There was no QRS widening at peak exercise.  Blood pressure response was normal.  This is interpreted as a negative stress test. There is no evidence of ischemia.        CT SCANS  CT CARDIAC SCORING (SELF PAY ONLY) 03/10/2020  Narrative CLINICAL DATA:  High cholesterol  EXAM: CT CARDIAC CORONARY ARTERY CALCIUM SCORE  TECHNIQUE: Non-contrast imaging through the heart was performed using prospective ECG gating. Image post processing was performed on an independent workstation, allowing for quantitative analysis of the heart and coronary arteries. Note that this exam targets the heart and the chest was not imaged in its entirety.  COMPARISON:  None.  FINDINGS: CORONARY CALCIUM SCORES:  Left Main: 0  LAD: 129  LCx: 5.4  RCA: 0  Total Agatston Score: 134.4  MESA database percentile: 91  AORTA MEASUREMENTS:  Ascending Aorta: 31  mm  Descending Aorta: 24 mm  OTHER FINDINGS:  Heart is normal size. No adenopathy. Visualized lungs clear. No effusions. Imaging into the upper abdomen shows no acute findings. Chest wall soft tissues are unremarkable. No acute bony abnormality.  IMPRESSION: The observed calcium score of 134.4 is at the percentile 91 for subjects of the same age, gender and race/ethnicity who are free of clinical cardiovascular disease and treated diabetes.  No acute or significant extracardiac abnormality   Electronically Signed By: Charlett Nose M.D. On: 03/13/2020 10:20     ______________________________________________________________________________________________     Recent Labs: No results found for requested labs within last 365 days.  Recent Lipid Panel    Component Value Date/Time   CHOL 219 (H) 06/12/2022 1126   TRIG 51 06/12/2022 1126   TRIG 103 10/28/2006 0855   HDL 59 06/12/2022 1126   CHOLHDL 3.7 06/12/2022 1126   CHOLHDL 5 06/14/2009 0956   VLDL 21.2 06/14/2009 0956   LDLCALC 151 (H) 06/12/2022 1126   LDLDIRECT 169.8 06/14/2009 0956    History of Present Illness    53 year old with the above past medical history including elevated coronary artery calcium score, hyperlipidemia, and ADHD.  He was referred to cardiology in the setting of coronary artery calcium score.  Coronary artery calcium score in 2021 was 134.4 (91st percentile).  ETT in 05/2021 was negative for ischemia.  He was started on pravastatin.  He was last seen in the office on 06/12/2022 and was stable from a cardiac standpoint.  He denies symptoms concerning for angina.  He presents today for follow-up.  Since his  last visit  Elevated coronary artery calcium score: Hyperlipidemia: Disposition:  Home Medications    Current Outpatient Medications  Medication Sig Dispense Refill   amphetamine-dextroamphetamine (ADDERALL) 10 MG tablet Take 10 mg by mouth 2 (two) times daily with a meal.     Multiple  Vitamins-Minerals (CENTRUM SILVER 50+MEN PO) Take by mouth.     pravastatin (PRAVACHOL) 40 MG tablet Take 1 tablet (40 mg total) by mouth every evening. (Patient not taking: Reported on 06/12/2022) 90 tablet 2   sildenafil (VIAGRA) 100 MG tablet Take one tablet 30 min  Before intercourse as needed.     No current facility-administered medications for this visit.     Review of Systems    ***.  All other systems reviewed and are otherwise negative except as noted above.    Physical Exam    VS:  There were no vitals taken for this visit. , BMI There is no height or weight on file to calculate BMI.     GEN: Well nourished, well developed, in no acute distress. HEENT: normal. Neck: Supple, no JVD, carotid bruits, or masses. Cardiac: RRR, no murmurs, rubs, or gallops. No clubbing, cyanosis, edema.  Radials/DP/PT 2+ and equal bilaterally.  Respiratory:  Respirations regular and unlabored, clear to auscultation bilaterally. GI: Soft, nontender, nondistended, BS + x 4. MS: no deformity or atrophy. Skin: warm and dry, no rash. Neuro:  Strength and sensation are intact. Psych: Normal affect.  Accessory Clinical Findings    ECG personally reviewed by me today -    - no acute changes.   Lab Results  Component Value Date   WBC 4.9 01/18/2009   HGB 15.3 01/18/2009   HCT 43.4 01/18/2009   MCV 96.5 01/18/2009   PLT 149.0 (L) 01/18/2009   Lab Results  Component Value Date   CREATININE 0.9 01/18/2009   BUN 13 01/18/2009   NA 140 01/18/2009   K 4.2 01/18/2009   CL 105 01/18/2009   CO2 30 01/18/2009   Lab Results  Component Value Date   ALT 51 (H) 06/12/2022   AST 43 (H) 06/12/2022   ALKPHOS 47 06/12/2022   BILITOT 0.9 06/12/2022   Lab Results  Component Value Date   CHOL 219 (H) 06/12/2022   HDL 59 06/12/2022   LDLCALC 151 (H) 06/12/2022   LDLDIRECT 169.8 06/14/2009   TRIG 51 06/12/2022   CHOLHDL 3.7 06/12/2022    No results found for: "HGBA1C"  Assessment & Plan    1.   ***  No BP recorded.  {Refresh Note OR Click here to enter BP  :1}***   Tanner Grapes, NP 01/22/2024, 6:04 AM

## 2024-01-30 ENCOUNTER — Ambulatory Visit
Admission: RE | Admit: 2024-01-30 | Discharge: 2024-01-30 | Disposition: A | Discharge status: Home / Self Care | Source: Ambulatory Visit | Attending: Nurse Practitioner | Admitting: Nurse Practitioner

## 2024-01-30 DIAGNOSIS — K760 Fatty (change of) liver, not elsewhere classified: Secondary | ICD-10-CM
